# Patient Record
Sex: Female | Born: 1985 | Race: White | Hispanic: No | Marital: Single
Health system: Southern US, Community
[De-identification: ages and names within clinical notes are randomized; demographics above are authoritative.]

---

## 2021-06-18 ENCOUNTER — Emergency Department: Payer: 59

## 2021-06-18 ENCOUNTER — Emergency Department
Admission: EM | Admit: 2021-06-18 | Discharge: 2021-06-18 | Disposition: A | Discharge status: Home / Self Care | Payer: 59 | Attending: Emergency Medicine | Admitting: Emergency Medicine

## 2021-06-18 ENCOUNTER — Other Ambulatory Visit: Payer: Self-pay

## 2021-06-18 DIAGNOSIS — O039 Complete or unspecified spontaneous abortion without complication: Secondary | ICD-10-CM | POA: Diagnosis not present

## 2021-06-18 DIAGNOSIS — O009 Unspecified ectopic pregnancy without intrauterine pregnancy: Secondary | ICD-10-CM | POA: Diagnosis not present

## 2021-06-18 DIAGNOSIS — Z3A01 Less than 8 weeks gestation of pregnancy: Secondary | ICD-10-CM | POA: Diagnosis not present

## 2021-06-18 DIAGNOSIS — N920 Excessive and frequent menstruation with regular cycle: Secondary | ICD-10-CM

## 2021-06-18 DIAGNOSIS — O209 Hemorrhage in early pregnancy, unspecified: Secondary | ICD-10-CM | POA: Diagnosis present

## 2021-06-18 DIAGNOSIS — O4690 Antepartum hemorrhage, unspecified, unspecified trimester: Secondary | ICD-10-CM

## 2021-06-18 DIAGNOSIS — O034 Incomplete spontaneous abortion without complication: Secondary | ICD-10-CM

## 2021-06-18 LAB — COMPREHENSIVE METABOLIC PANEL
ALT: 15 U/L (ref 0–44)
AST: 15 U/L (ref 15–41)
Albumin: 3.9 g/dL (ref 3.5–5.0)
Alkaline Phosphatase: 62 U/L (ref 38–126)
Anion gap: 10 (ref 5–15)
BUN: 8 mg/dL (ref 6–20)
CO2: 22 mmol/L (ref 22–32)
Calcium: 9.1 mg/dL (ref 8.9–10.3)
Chloride: 103 mmol/L (ref 98–111)
Creatinine, Ser: 0.6 mg/dL (ref 0.44–1.00)
GFR, Estimated: >60 mL/min (ref >60)
Glucose, Bld: 120 mg/dL — ABNORMAL HIGH (ref 70–99)
Potassium: 4 mmol/L (ref 3.5–5.1)
Sodium: 135 mmol/L (ref 135–145)
Total Bilirubin: 0.5 mg/dL (ref 0.3–1.2)
Total Protein: 7.1 g/dL (ref 6.5–8.1)

## 2021-06-18 LAB — CBC WITH DIFFERENTIAL/PLATELET
Abs Immature Granulocytes: 0.01 10*3/uL (ref 0.00–0.07)
Basophils Absolute: 0 10*3/uL (ref 0.0–0.1)
Basophils Relative: 1 %
Eosinophils Absolute: 0.2 10*3/uL (ref 0.0–0.5)
Eosinophils Relative: 4 %
HCT: 38.7 % (ref 36.0–46.0)
Hemoglobin: 12.3 g/dL (ref 12.0–15.0)
Immature Granulocytes: 0 %
Lymphocytes Relative: 24 %
Lymphs Abs: 1.3 10*3/uL (ref 0.7–4.0)
MCH: 24.8 pg — ABNORMAL LOW (ref 26.0–34.0)
MCHC: 31.8 g/dL (ref 30.0–36.0)
MCV: 78 fL — ABNORMAL LOW (ref 80.0–100.0)
Monocytes Absolute: 0.4 10*3/uL (ref 0.1–1.0)
Monocytes Relative: 7 %
Neutro Abs: 3.6 10*3/uL (ref 1.7–7.7)
Neutrophils Relative %: 64 %
Platelets: 376 10*3/uL (ref 150–400)
RBC: 4.96 MIL/uL (ref 3.87–5.11)
RDW: 15.9 % — ABNORMAL HIGH (ref 11.5–15.5)
WBC: 5.5 10*3/uL (ref 4.0–10.5)
nRBC: 0 % (ref 0.0–0.2)

## 2021-06-18 LAB — HCG, QUANTITATIVE, PREGNANCY: hCG, Beta Chain, Quant, S: 28906 m[IU]/mL — ABNORMAL HIGH (ref <5)

## 2021-06-18 MED ORDER — SODIUM CHLORIDE 0.9 % IV BOLUS
1000.0000 mL | Freq: Once | INTRAVENOUS | Status: AC
  Administered 2021-06-18: 1000 mL via INTRAVENOUS

## 2021-06-18 NOTE — ED Notes (Addendum)
Pt became pale, dizzy and diaphoretic while getting vitals. Pt assisted into Wheelchair and taken back to room at this time

## 2021-06-18 NOTE — ED Provider Notes (Signed)
[facility] Emergency Department Provider Note   ____________________________________________   Event Date/Time   First MD Initiated Contact with Patient 06/18/21 1018     (approximate)  I have reviewed the triage vital signs and the nursing notes.   HISTORY  Chief Complaint Vaginal Bleeding    HPI Olivia Moore is a 35 y.o. female with stated past medical history of 57-month pregnancy presents for vaginal bleeding  LOCATION: Vagina DURATION: 2 days TIMING: Worsening since onset SEVERITY: Moderate QUALITY: Vaginal bleeding CONTEXT: Patient states began with vaginal spotting but is not passing clots MODIFYING FACTORS: Denies any exacerbating or relieving factors ASSOCIATED SYMPTOMS: Abdominal cramping in the suprapubic region   Per medical record review, noncontributory past medical history          History reviewed. No pertinent past medical history.  There are no problems to display for this patient.   History reviewed. No pertinent surgical history.  Prior to Admission medications   Not on File    Allergies Patient has no allergy information on record.  No family history on file.  Social History    Review of Systems Constitutional: No fever/chills Eyes: No visual changes. ENT: No sore throat. Cardiovascular: Denies chest pain. Respiratory: Denies shortness of breath. Gastrointestinal: Endorses mild suprapubic abdominal pain.  No nausea, no vomiting.  No diarrhea. Genitourinary: Negative for dysuria.  Endorses vaginal bleeding Musculoskeletal: Negative for acute arthralgias Skin: Negative for rash. Neurological: Negative for headaches, weakness/numbness/paresthesias in any extremity Psychiatric: Negative for suicidal ideation/homicidal ideation   ____________________________________________   PHYSICAL EXAM:  VITAL SIGNS: ED Triage Vitals  Enc Vitals Group     BP 06/18/21 1013 (!) 68/41     Pulse Rate 06/18/21  1013 (!) 44     Resp 06/18/21 1013 18     Temp --      Temp src --      SpO2 06/18/21 1013 100 %     Weight --      Height --      Head Circumference --      Peak Flow --      Pain Score 06/18/21 0956 0     Pain Loc --      Pain Edu? --      Excl. in GC? --    Constitutional: Alert and oriented. Well appearing and in no acute distress. Eyes: Conjunctivae are normal. PERRL. Head: Atraumatic. Nose: No congestion/rhinnorhea. Mouth/Throat: Mucous membranes are moist. Neck: No stridor Cardiovascular: Grossly normal heart sounds.  Good peripheral circulation. Respiratory: Normal respiratory effort.  No retractions. Gastrointestinal: Soft and nontender. No distention. Musculoskeletal: No obvious deformities Neurologic:  Normal speech and language. No gross focal neurologic deficits are appreciated. Skin:  Skin is warm and dry. No rash noted. Psychiatric: Mood and affect are normal. Speech and behavior are normal.  ____________________________________________   LABS (all labs ordered are listed, but only abnormal results are displayed)  Labs Reviewed  HCG, QUANTITATIVE, PREGNANCY - Abnormal; Notable for the following components:      Result Value   hCG, Beta Chain, Quant, S 28,906 (*)    All other components within normal limits  CBC WITH DIFFERENTIAL/PLATELET - Abnormal; Notable for the following components:   MCV 78.0 (*)    MCH 24.8 (*)    RDW 15.9 (*)    All other components within normal limits  COMPREHENSIVE METABOLIC PANEL - Abnormal; Notable for the following components:   Glucose, Bld 120 (*)    All other  components within normal limits  URINALYSIS, COMPLETE (UACMP) WITH MICROSCOPIC  POC URINE PREG, ED   ____________________________________________  EKG  ED ECG REPORT I, Merwyn Katos, the attending physician, personally viewed and interpreted this ECG.  Date: 06/18/2021 EKG Time: 1021 Rate: 62 Rhythm: normal sinus rhythm QRS Axis: normal Intervals:  normal ST/T Wave abnormalities: normal Narrative Interpretation: no evidence of acute ischemia  ____________________________________________  RADIOLOGY  ED MD interpretation: OB ultrasound shows a single intrauterine gestation with crown-rump length at 6 weeks without any fetal cardiac activity suspicious for a failed pregnancy  Official radiology report(s): US OB LESS THAN 14 WEEKS WITH OB TRANSVAGINAL  Result Date: 06/18/2021 CLINICAL DATA:  Spotting in first trimester of pregnancy, patient states [redacted] weeks pregnant, LMP 04/20/2021, quantitative beta HCG = 28,906 EXAM: OBSTETRIC <14 WK [place] AND TRANSVAGINAL OB US TECHNIQUE: Both transabdominal and transvaginal ultrasound examinations were performed for complete evaluation of the gestation as well as the maternal uterus, adnexal regions, and pelvic cul-de-sac. Transvaginal technique was performed to assess early pregnancy. COMPARISON:  None FINDINGS: Intrauterine gestational sac: Present, single Yolk sac:  Present Embryo:  Present Cardiac Activity: Not identified Heart Rate: N/A  bpm CRL:  3.8 mm 6 w   1 d                  [place] EDC: 02/10/2022 Subchorionic hemorrhage:  None visualized. Maternal uterus/adnexae: Uterus anteverted, otherwise unremarkable. LEFT ovary normal size and morphology, 2.9 x 2.0 x 2.1 cm. RIGHT ovary measures 3.0 x 1.5 x 2.1 cm and contains a small corpus luteal cyst. No free pelvic fluid or adnexal masses. IMPRESSION: Single intrauterine gestation with crown-rump length corresponding to 6 weeks 1 day EGA. No fetal cardiac activity identified. Findings are suspicious but not yet definitive for failed pregnancy. Recommend follow-up US in 10-14 days for definitive diagnosis. This recommendation follows SRU consensus guidelines: Diagnostic Criteria for Nonviable Pregnancy Early in the First Trimester. Malva Limes Med 2013; 973:5329-92. Electronically Signed   By: Ulyses Southward M.D.   On: 06/18/2021 12:47     ____________________________________________   PROCEDURES  Procedure(s) performed (including Critical Care):  .1-3 Lead EKG Interpretation  Date/Time: 06/18/2021 2:22 PM Performed by: Merwyn Katos, MD Authorized by: Merwyn Katos, MD     Interpretation: normal     ECG rate:  64   ECG rate assessment: normal     Rhythm: sinus rhythm     Ectopy: none     Conduction: normal     ____________________________________________   INITIAL IMPRESSION / ASSESSMENT AND PLAN / ED COURSE  As part of my medical decision making, I reviewed the following data within the electronic medical record, if available:  Nursing notes reviewed and incorporated, Labs reviewed, EKG interpreted, Old chart reviewed, Radiograph reviewed and Notes from prior ED visits reviewed and incorporated     This pregnant patient presents with vaginal bleeding in the first trimester. DDX includes ectopic, IUP, threatened/inevitable abortion, along with completed abortion. Patient is HDS and without a history of coagulopathy or infectious symptoms. Doubt alternate acute emergent pathology.  Plan: bHCG, +/- basic labs, type and screen, TVUS, reassess Results expressed with patient as to possibility of miscarriage and need for follow-up with OB/GYN within the next 7 days.  Patient expressed understanding and all questions were answered.  Dispo: Discharge home with OB/GYN follow-up      ____________________________________________   FINAL CLINICAL IMPRESSION(S) / ED DIAGNOSES  Final diagnoses:  Inevitable spontaneous abortion  Vaginal  bleeding in pregnancy     ED Discharge Orders     None        Note:  This document was prepared using Dragon voice recognition software and may include unintentional dictation errors.    Merwyn Katos, MD 06/18/21 [phone]

## 2021-06-18 NOTE — ED Triage Notes (Signed)
Pt comes with c/o possible miscarriage. Pt states she is [redacted] week along. Pt states she is now passing clots.

## 2021-06-18 NOTE — ED Notes (Signed)
Pt returned from US

## 2021-06-18 NOTE — ED Notes (Signed)
Pt transported to US

## 2021-12-10 IMAGING — US US OB < 14 WEEKS - US OB TV
1 series · 15 of 28 positions shown · non-contrast
Comparison: None

CLINICAL DATA: Spotting in first trimester of pregnancy, patient
states 9 weeks pregnant, LMP 04/20/2021, quantitative beta HCG =
28,906

EXAM:
OBSTETRIC <14 WK US AND TRANSVAGINAL OB US
TECHNIQUE: Both transabdominal and transvaginal ultrasound examinations were
performed for complete evaluation of the gestation as well as the
maternal uterus, adnexal regions, and pelvic cul-de-sac.
Transvaginal technique was performed to assess early pregnancy.

[Series 1: us ob comp less 14 wks · 15 of 54 slices shown]
[im 1/54]
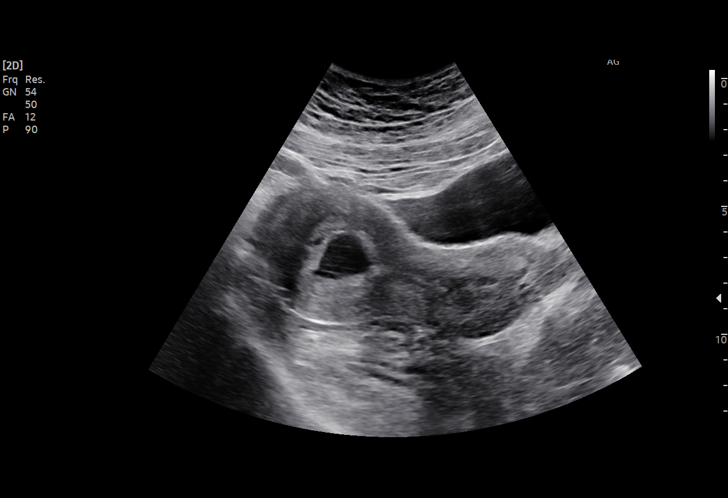
[im 4/54]
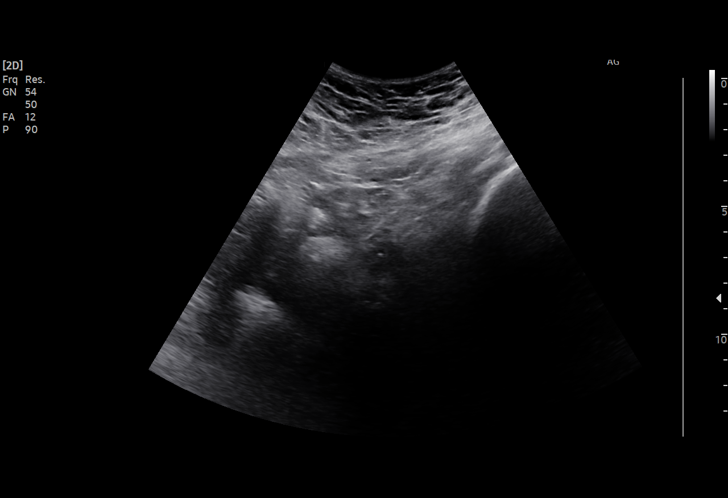
[im 8/54]
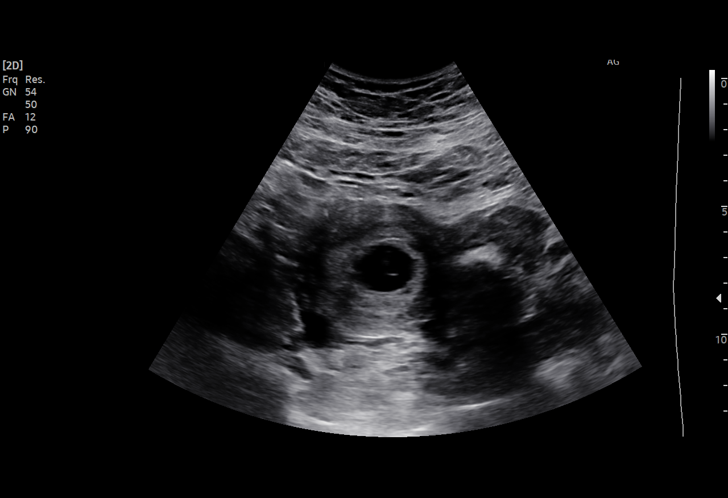
[im 12/54]
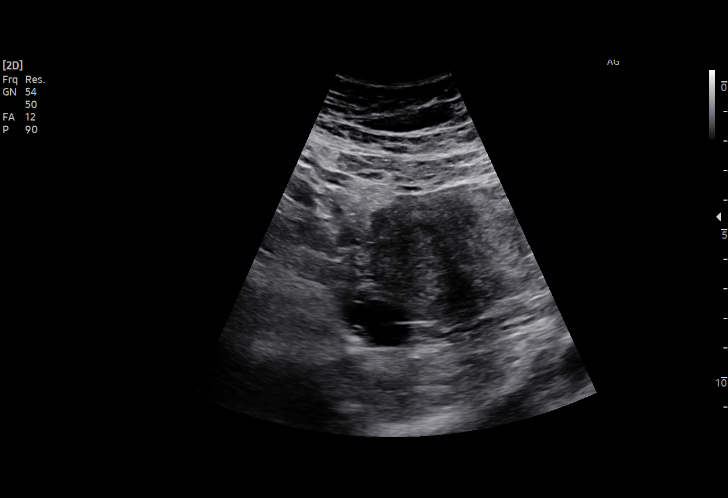
[im 16/54]
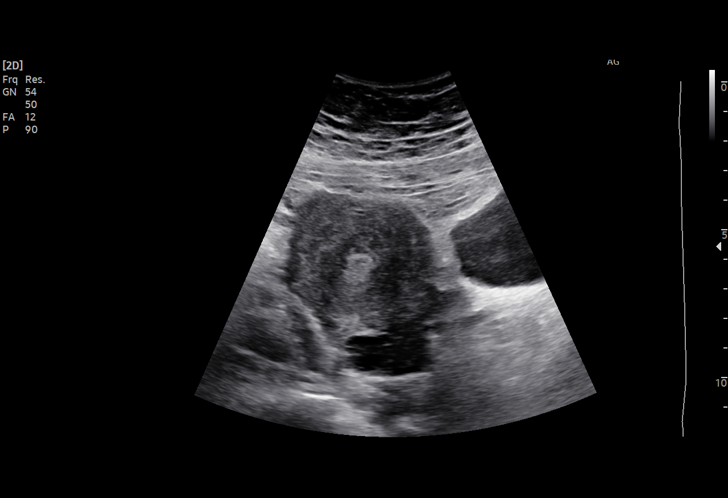
[im 20/54]
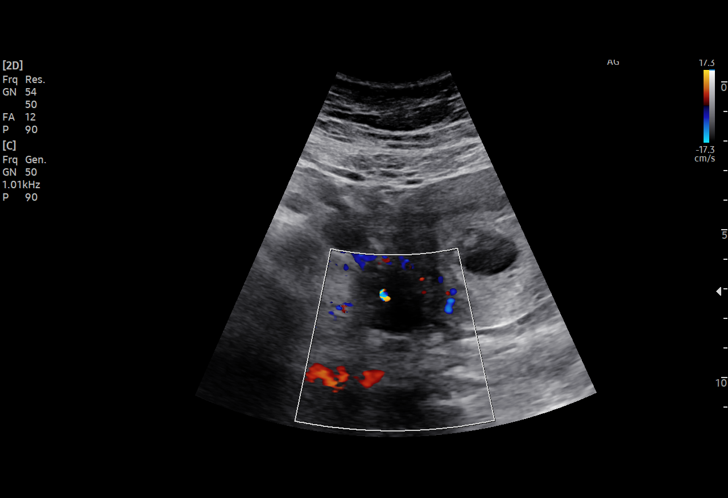
[im 24/54]
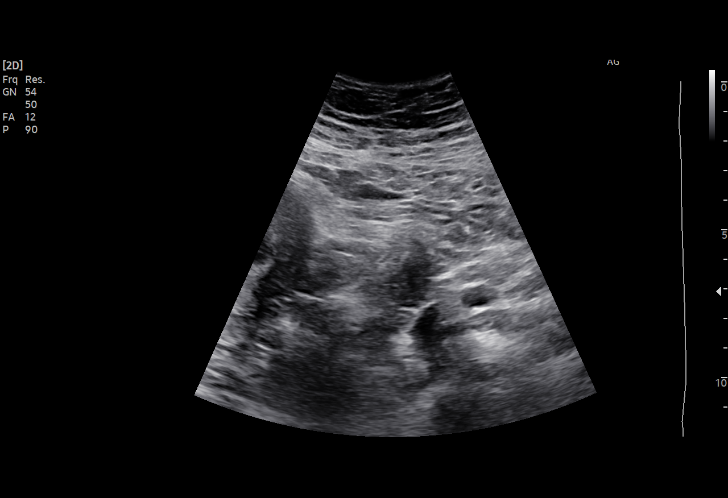
[im 28/54]
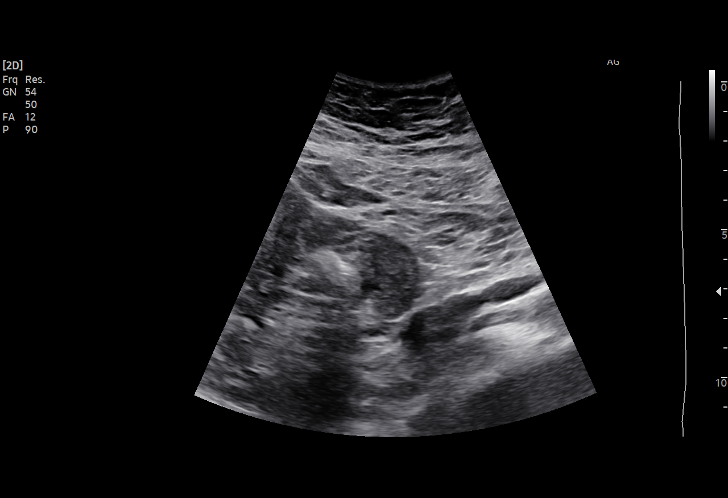
[im 30/54]
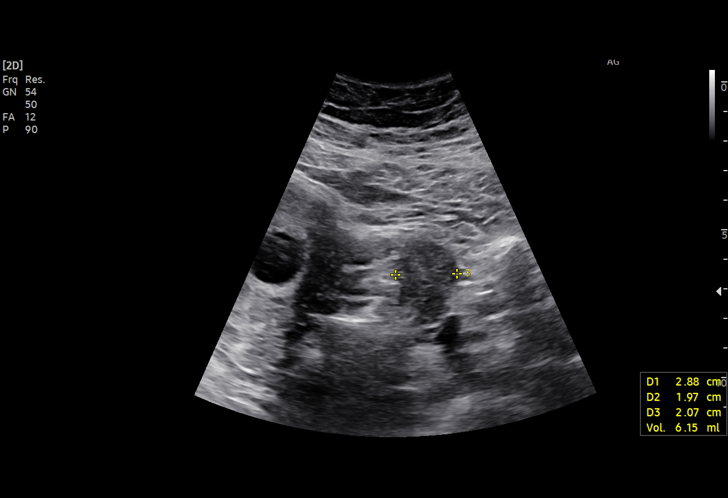
[im 34/54]
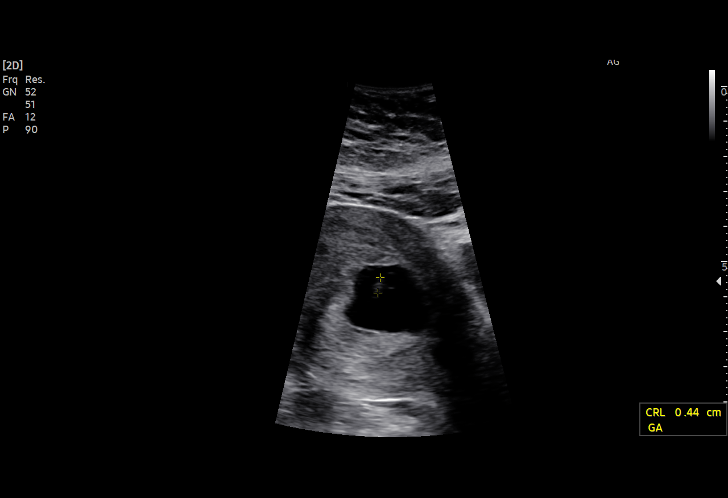
[im 38/54]
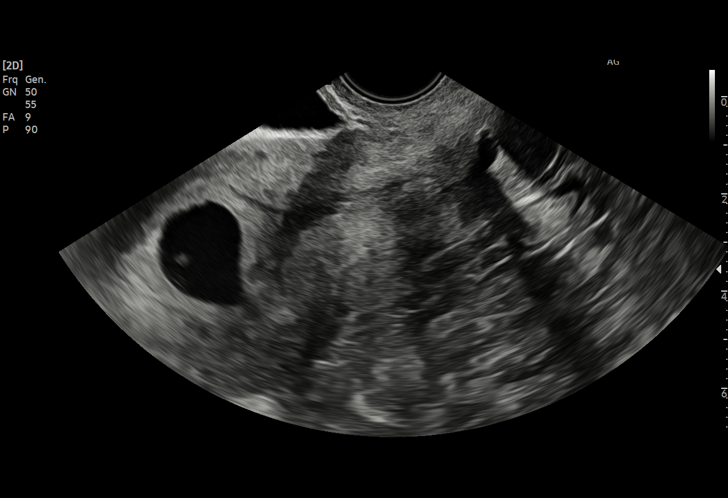
[im 42/54]
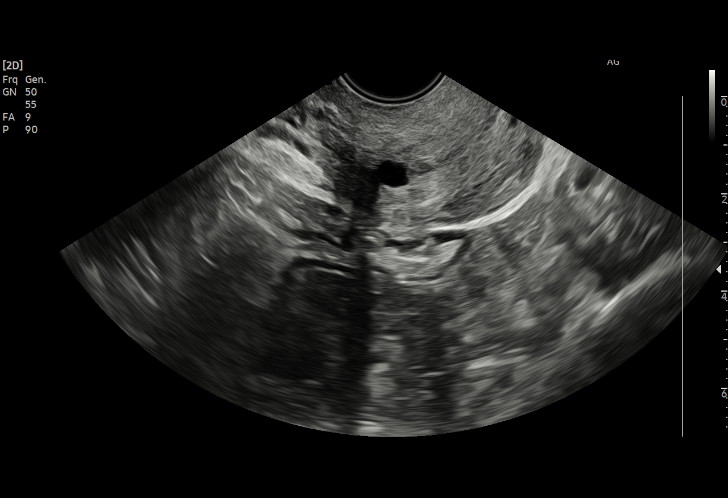
[im 46/54]
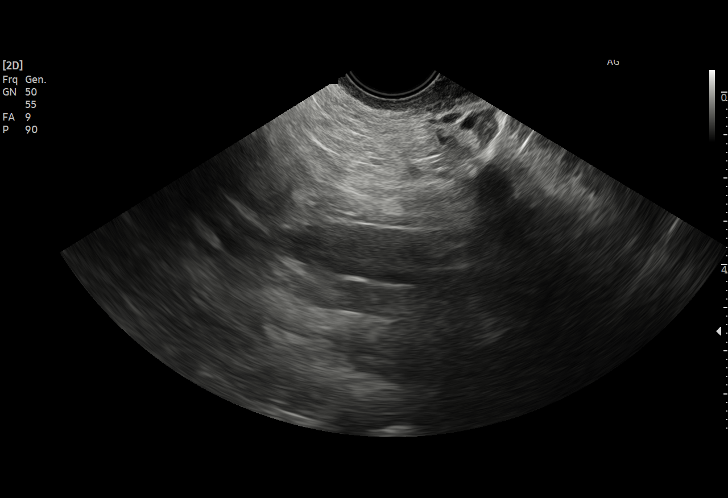
[im 50/54]
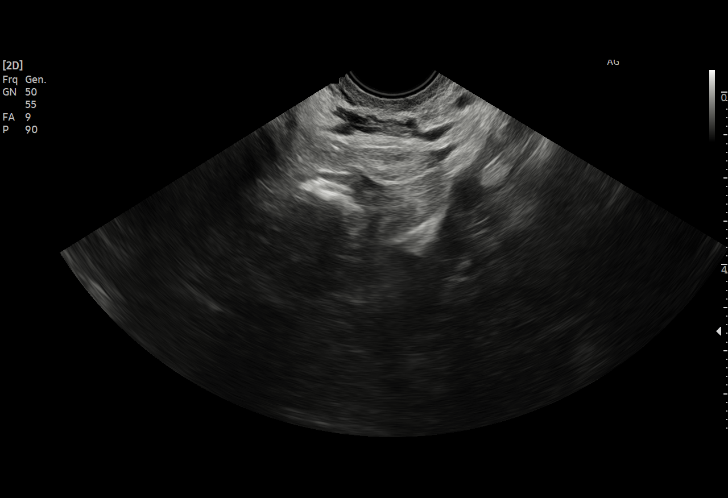
[im 54/54]
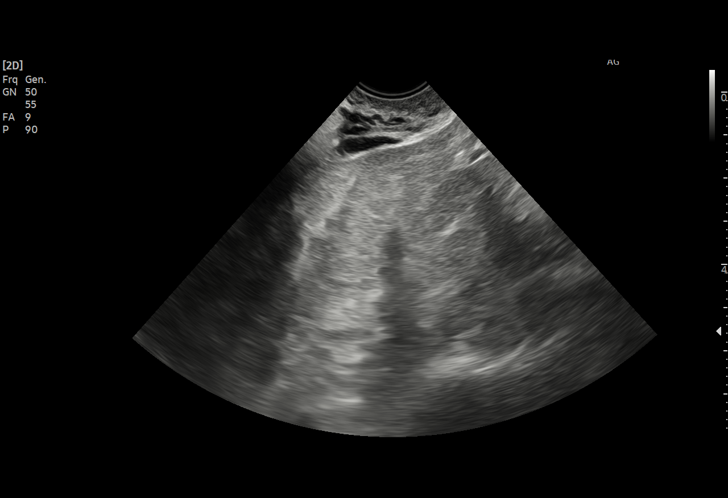

[15 of 28 positions shown; findings below may reference images not displayed]

FINDINGS: Intrauterine gestational sac: Present, single

Yolk sac:  Present

Embryo:  Present

Cardiac Activity: Not identified

Heart Rate: N/A  bpm

CRL:  3.8 mm 6 w   1 d                  US EDC: 02/10/2022

Subchorionic hemorrhage:  None visualized.

Maternal uterus/adnexae:

Uterus anteverted, otherwise unremarkable.

LEFT ovary normal size and morphology, 2.9 x 2.0 x 2.1 cm.

RIGHT ovary measures 3.0 x 1.5 x 2.1 cm and contains a small corpus
luteal cyst.

No free pelvic fluid or adnexal masses.
IMPRESSION: Single intrauterine gestation with crown-rump length corresponding
to 6 weeks 1 day EGA.

No fetal cardiac activity identified.

Findings are suspicious but not yet definitive for failed pregnancy.
Recommend follow-up US in 10-14 days for definitive diagnosis. This
recommendation follows SRU consensus guidelines: Diagnostic Criteria
for Nonviable Pregnancy Early in the First Trimester. N Engl J Med
